# Patient Record
Sex: Female | Born: 1985 | Race: White | Hispanic: No | Marital: Single | Smoking: Former smoker
Health system: Southern US, Community
[De-identification: ages and names within clinical notes are randomized; demographics above are authoritative.]

## PROBLEM LIST (undated history)

## (undated) DIAGNOSIS — I10 Essential (primary) hypertension: Secondary | ICD-10-CM

## (undated) DIAGNOSIS — G43909 Migraine, unspecified, not intractable, without status migrainosus: Secondary | ICD-10-CM

## (undated) DIAGNOSIS — N390 Urinary tract infection, site not specified: Secondary | ICD-10-CM

## (undated) HISTORY — DX: Migraine, unspecified, not intractable, without status migrainosus: G43.909

## (undated) HISTORY — DX: Essential (primary) hypertension: I10

## (undated) HISTORY — DX: Urinary tract infection, site not specified: N39.0

---

## 2011-06-09 ENCOUNTER — Ambulatory Visit: Payer: Self-pay | Admitting: Family Medicine

## 2011-06-28 ENCOUNTER — Ambulatory Visit: Payer: Self-pay | Admitting: Family Medicine

## 2011-06-28 DIAGNOSIS — Z0289 Encounter for other administrative examinations: Secondary | ICD-10-CM

## 2012-02-19 ENCOUNTER — Encounter: Payer: Self-pay | Admitting: Family Medicine

## 2012-02-19 ENCOUNTER — Ambulatory Visit (INDEPENDENT_AMBULATORY_CARE_PROVIDER_SITE_OTHER): Payer: BC Managed Care – PPO | Admitting: Family Medicine

## 2012-02-19 VITALS — BP 144/100 | HR 76 | Temp 98.2°F | Ht 63.0 in | Wt 179.5 lb

## 2012-02-19 DIAGNOSIS — E663 Overweight: Secondary | ICD-10-CM | POA: Insufficient documentation

## 2012-02-19 DIAGNOSIS — R5381 Other malaise: Secondary | ICD-10-CM

## 2012-02-19 DIAGNOSIS — G43909 Migraine, unspecified, not intractable, without status migrainosus: Secondary | ICD-10-CM | POA: Insufficient documentation

## 2012-02-19 DIAGNOSIS — I10 Essential (primary) hypertension: Secondary | ICD-10-CM | POA: Insufficient documentation

## 2012-02-19 DIAGNOSIS — R5383 Other fatigue: Secondary | ICD-10-CM | POA: Insufficient documentation

## 2012-02-19 LAB — LIPID PANEL
HDL: 77.3 mg/dL (ref >39.00)
Triglycerides: 163 mg/dL — ABNORMAL HIGH (ref 0.0–149.0)

## 2012-02-19 LAB — COMPREHENSIVE METABOLIC PANEL
Albumin: 3.9 g/dL (ref 3.5–5.2)
Alkaline Phosphatase: 66 U/L (ref 39–117)
BUN: 13 mg/dL (ref 6–23)
CO2: 25 mEq/L (ref 19–32)
GFR: 91.77 mL/min (ref >60.00)
Glucose, Bld: 77 mg/dL (ref 70–99)
Potassium: 4.3 mEq/L (ref 3.5–5.1)
Total Bilirubin: 0.6 mg/dL (ref 0.3–1.2)
Total Protein: 7.4 g/dL (ref 6.0–8.3)

## 2012-02-19 LAB — CBC WITH DIFFERENTIAL/PLATELET
Basophils Absolute: 0 10*3/uL (ref 0.0–0.1)
Eosinophils Relative: 2.8 % (ref 0.0–5.0)
HCT: 42 % (ref 36.0–46.0)
Lymphocytes Relative: 40.7 % (ref 12.0–46.0)
Lymphs Abs: 2.3 10*3/uL (ref 0.7–4.0)
Monocytes Relative: 7.5 % (ref 3.0–12.0)
Platelets: 308 10*3/uL (ref 150.0–400.0)
RDW: 13.5 % (ref 11.5–14.6)
WBC: 5.7 10*3/uL (ref 4.5–10.5)

## 2012-02-19 LAB — TSH: TSH: 0.68 u[IU]/mL (ref 0.35–5.50)

## 2012-02-19 LAB — VITAMIN B12: Vitamin B-12: 191 pg/mL — ABNORMAL LOW (ref 211–911)

## 2012-02-19 LAB — LDL CHOLESTEROL, DIRECT: Direct LDL: 118.1 mg/dL

## 2012-02-19 MED ORDER — HYDROCHLOROTHIAZIDE 12.5 MG PO CAPS: 12.5000 mg | ORAL_CAPSULE | Freq: Every day | ORAL | Status: DC

## 2012-02-19 NOTE — Assessment & Plan Note (Signed)
Check for reversible causes of fatigue.  

## 2012-02-19 NOTE — Progress Notes (Signed)
Subjective:    Patient ID: Nichole Mendoza, female    DOB: 18-Mar-1986, 26 y.o.   MRN: [id number]  HPI CC: new pt to establish  HTN - on several different occasion.  First noted at [facility] eval (12/2011).  145-160/100.  Has been checking at local pharmacy.    Migraines 1x/wk associated with photo/phonophobia and nausea/vomiting, usually on right side of head.  Milder HA several times a week. Has tried imitrex in past, bad reaction to this.  Ibuprofen 800mg  works ok.  In past year has changed diet to eat healthier, exercise more, however has been gaining weight despite all this.  Gained 15 lbs in last 1 year.  Also notes staying fatigued on a consistent basis.  + skin changes - drier.  + heat and cold intolerance.  On average sleeping 7-8 hours /night.  1 cup coffee/day.  + abd pain for last year - sharp pains in lower abdomen associated with distension.  Not related to cycle.    Some depression/anxiety recently for last year, worse since father died.  Died from lung cancer.  Sometimes affecting relationship with husband.  Has considered counseling in past.  Sleep, appetite, concentration ok.  Energy level down.  No SI/HI, no guilt feelings.  LMP: 02/06/2012.  Regular cycle, about 5 days, no heavy bleeding. Recently stopped birth control, had been on birth control for 12 years prior. Talking about pregnancy with husband.  Caffeine: 1 cup coffee Lives with husband, 1 step son, 2 cats Occupation: Airline pilot Edu: some college Activity: runs 4-5 days/wk 3-5 mi Diet: healthy.  Chicken, fish, vegetables.  Avoids carbs.  Good water, vegetables daily.  Doesn't like fruits.  Preventative: Well woman with [facility] 01/2011.  Normal blood pressure then. Unsure about tetanus shot. Declines flu shot today.  Medications and allergies reviewed and updated in chart.  Past histories reviewed and updated if relevant as below. There is no problem list on file for this patient.  Past Medical History    Diagnosis Date  . Migraines   . HTN (hypertension)    No past surgical history on file. History  Substance Use Topics  . Smoking status: Former Games developer  . Smokeless tobacco: Never Used   Comment: occasional social  . Alcohol Use: Yes     3 times/week   Family History  Problem Relation Age of Onset  . Heart disease Maternal Grandfather 50  . Cancer Father 60    lung, throat, smoker  . Hyperlipidemia Mother   . Alcohol abuse Father   . Bipolar disorder Father   . Drug abuse Father   . Diabetes Neg Hx   . Hypertension Neg Hx   . Stroke Neg Hx    No Known Allergies No current outpatient prescriptions on file prior to visit.     Review of Systems  Constitutional: Positive for unexpected weight change. Negative for fever, chills, activity change, appetite change and fatigue.  HENT: Negative for hearing loss and neck pain.   Eyes: Negative for visual disturbance.  Respiratory: Negative for cough, chest tightness, shortness of breath and wheezing.   Cardiovascular: Negative for chest pain, palpitations and leg swelling.  Gastrointestinal: Positive for abdominal pain and constipation. Negative for nausea, vomiting, diarrhea, blood in stool and abdominal distention.  Genitourinary: Negative for hematuria and difficulty urinating.  Musculoskeletal: Negative for myalgias and arthralgias.  Skin: Negative for rash.  Neurological: Positive for headaches (migraines). Negative for dizziness, seizures and syncope.  Hematological: Does not bruise/bleed easily.  Psychiatric/Behavioral: Positive  for dysphoric mood. The patient is nervous/anxious.        Objective:   Physical Exam  Nursing note and vitals reviewed. Constitutional: She is oriented to person, place, and time. She appears well-developed and well-nourished. No distress.       overweight  HENT:  Head: Normocephalic and atraumatic.  Right Ear: External ear normal.  Left Ear: External ear normal.  Nose: Nose normal.   Mouth/Throat: Oropharynx is clear and moist. No oropharyngeal exudate.  Eyes: Conjunctivae normal and EOM are normal. Pupils are equal, round, and reactive to light. No scleral icterus.  Neck: Normal range of motion. Neck supple. No thyromegaly present.  Cardiovascular: Normal rate, regular rhythm, normal heart sounds and intact distal pulses.   No murmur heard. Pulses:      Radial pulses are 2+ on the right side, and 2+ on the left side.  Pulmonary/Chest: Effort normal and breath sounds normal. No respiratory distress. She has no wheezes. She has no rales.  Abdominal: Soft. Bowel sounds are normal. She exhibits no distension and no mass. There is no tenderness. There is no rebound and no guarding.  Musculoskeletal: Normal range of motion. She exhibits no edema.  Lymphadenopathy:    She has no cervical adenopathy.  Neurological: She is alert and oriented to person, place, and time.       CN grossly intact, station and gait intact  Skin: Skin is warm and dry. No rash noted.  Psychiatric: She has a normal mood and affect. Her behavior is normal. Judgment and thought content normal.       Assessment & Plan:

## 2012-02-19 NOTE — Assessment & Plan Note (Signed)
1x/month.  Continue to monitor for now. Declines flexeril.  Has not tolerated triptan in past. Pt declines daily ppx medication currently.

## 2012-02-19 NOTE — Assessment & Plan Note (Addendum)
New dx as of last few months.  Young for HTN, no fmhx.  Will start with checking blood work.  Consider checking aldosterone next visit if difficult to control BP. Check EKG next visit, repeat K.

## 2012-02-19 NOTE — Assessment & Plan Note (Signed)
Strange that she's continued to gain weight despite very healthy diet/lifestyle. Check TSH, consider checking further blood work next month.

## 2012-02-19 NOTE — Patient Instructions (Signed)
Blood work today to check on blood pressure and fatigue. Let's start you on hydrochlorothiazide today - water pill. Return in 1 month for follow up, sooner if needed. Good to meet you today, call us with questions

## 2012-02-20 ENCOUNTER — Other Ambulatory Visit: Payer: Self-pay | Admitting: *Deleted

## 2012-02-20 LAB — VITAMIN D 25 HYDROXY (VIT D DEFICIENCY, FRACTURES): Vit D, 25-Hydroxy: 36 ng/mL (ref 30–89)

## 2012-02-21 ENCOUNTER — Ambulatory Visit (INDEPENDENT_AMBULATORY_CARE_PROVIDER_SITE_OTHER): Payer: BC Managed Care – PPO | Admitting: Family Medicine

## 2012-02-21 DIAGNOSIS — E538 Deficiency of other specified B group vitamins: Secondary | ICD-10-CM

## 2012-02-21 MED ORDER — CYANOCOBALAMIN 1000 MCG/ML IJ SOLN
1000.0000 ug | Freq: Once | INTRAMUSCULAR | Status: AC
  Administered 2012-02-21: 1000 ug via INTRAMUSCULAR

## 2012-02-22 ENCOUNTER — Telehealth: Payer: Self-pay

## 2012-02-22 NOTE — Telephone Encounter (Signed)
Pt started HCTZ 02/19/12 as prescribed; on 02/21/12 the first time exercised (running at gym)since been on HCTZ pt got clammy,pale and felt like going to pass out (pt did not pass out); pt layed down for 15 mins, drank Coke and after 1 hour felt normal self. Pt had gotten Vit B12 shot one hour before exercising also.  Pt took HCTZ today (has not been exercising today) and feels normal.  Pt wants to be able to exercise without feeling like going to pass out.Please advise.[facility]. Advised pt not to exercise until heard from Dr Sharen Hones.pt voiced understanding.

## 2012-02-23 NOTE — Telephone Encounter (Signed)
Patient notified. She will make sure she is staying well hydrated and hold off on exercise x 1 week . She will check BP and let me know if running too high or too low.

## 2012-02-23 NOTE — Telephone Encounter (Signed)
This may just be period while body gets used to hydrochlorothiazide.  I would give more time, not exercise for 1 wk while starting medicine. HCTZ is a water pill - so also important that she is very careful with hydration status - esp if running and sweating.  Has she been sure to drink plenty of water? It would also be helpful to see how BP is running at home - can she check at home or at local store occasionally?

## 2012-02-29 ENCOUNTER — Encounter: Payer: Self-pay | Admitting: Family Medicine

## 2012-02-29 ENCOUNTER — Ambulatory Visit (INDEPENDENT_AMBULATORY_CARE_PROVIDER_SITE_OTHER): Payer: BC Managed Care – PPO | Admitting: Family Medicine

## 2012-02-29 VITALS — BP 138/80 | HR 98 | Temp 97.1°F | Wt 178.0 lb

## 2012-02-29 DIAGNOSIS — J069 Acute upper respiratory infection, unspecified: Secondary | ICD-10-CM

## 2012-02-29 MED ORDER — HYDROCODONE-HOMATROPINE 5-1.5 MG/5ML PO SYRP
5.0000 mL | ORAL_SOLUTION | Freq: Three times a day (TID) | ORAL | Status: DC | PRN
Start: 2012-02-29 — End: 2012-10-10

## 2012-02-29 NOTE — Patient Instructions (Addendum)
This is likely a virus.  Since you have high blood pressure, please avoid anything sudafed or labeled "D."    Drink lots of fluids.  Treat sympotmatically with Mucinex, nasal saline irrigation, and Tylenol/Ibuprofen. Also try claritin  or zyrtec over the counter- two times a day as needed.  You can use warm compresses.  Cough suppressant at night. Call if not improving as expected in 5-7 days.

## 2012-02-29 NOTE — Progress Notes (Signed)
SUBJECTIVE:  Nichole Mendoza is a 26 y.o. female who complains of coryza, congestion, sneezing and sore throat for 1 day. She denies a history of anorexia, chest pain, dizziness, fevers, myalgias, shortness of breath, sweats, vomiting and weakness and denies a history of asthma. Patient denies smoke cigarettes.  Patient Active Problem List  Diagnosis  . Migraines  . HTN (hypertension)  . Fatigue  . Overweight   Past Medical History  Diagnosis Date  . Migraines   . HTN (hypertension)    No past surgical history on file. History  Substance Use Topics  . Smoking status: Former Games developer  . Smokeless tobacco: Never Used     Comment: occasional social  . Alcohol Use: Yes     Comment: 3 times/week   Family History  Problem Relation Age of Onset  . Heart disease Maternal Grandfather 50  . Cancer Father 60    lung, throat, smoker  . Hyperlipidemia Mother   . Alcohol abuse Father   . Bipolar disorder Father   . Drug abuse Father   . Diabetes Neg Hx   . Hypertension Neg Hx   . Stroke Neg Hx    No Known Allergies Current Outpatient Prescriptions on File Prior to Visit  Medication Sig Dispense Refill  . cyanocobalamin (,VITAMIN B-12,) 1000 MCG/ML injection Inject 1,000 mcg into the muscle every 30 (thirty) days.      . hydrochlorothiazide (MICROZIDE) 12.5 MG capsule Take 1 capsule (12.5 mg total) by mouth daily.  30 capsule  3   The PMH, PSH, Social History, Family History, Medications, and allergies have been reviewed in [facility], and have been updated if relevant.    OBJECTIVE: BP 138/80  Pulse 98  Temp 97.1 F (36.2 C)  Wt 178 lb (80.74 kg)  LMP 02/06/2012  She appears well, vital signs are as noted. Ears normal.  Throat and pharynx normal.  Neck supple. No adenopathy in the neck. Nose is congested. Sinuses non tender. The chest is clear, without wheezes or rales.  ASSESSMENT:  viral upper respiratory illness  PLAN: Symptomatic therapy suggested: push fluids, rest and return  office visit prn if symptoms persist or worsen. Lack of antibiotic effectiveness discussed with her. Call or return to clinic prn if these symptoms worsen or fail to improve as anticipated.

## 2012-03-18 ENCOUNTER — Ambulatory Visit: Payer: BC Managed Care – PPO | Admitting: Family Medicine

## 2012-03-26 ENCOUNTER — Ambulatory Visit (INDEPENDENT_AMBULATORY_CARE_PROVIDER_SITE_OTHER): Payer: BC Managed Care – PPO

## 2012-03-26 DIAGNOSIS — E538 Deficiency of other specified B group vitamins: Secondary | ICD-10-CM

## 2012-03-26 MED ORDER — CYANOCOBALAMIN 1000 MCG/ML IJ SOLN
1000.0000 ug | Freq: Once | INTRAMUSCULAR | Status: AC
  Administered 2012-03-26: 1000 ug via INTRAMUSCULAR

## 2012-04-30 ENCOUNTER — Ambulatory Visit (INDEPENDENT_AMBULATORY_CARE_PROVIDER_SITE_OTHER): Payer: BC Managed Care – PPO | Admitting: *Deleted

## 2012-04-30 DIAGNOSIS — E538 Deficiency of other specified B group vitamins: Secondary | ICD-10-CM

## 2012-04-30 MED ORDER — CYANOCOBALAMIN 1000 MCG/ML IJ SOLN
1000.0000 ug | Freq: Once | INTRAMUSCULAR | Status: AC
  Administered 2012-04-30: 1000 ug via INTRAMUSCULAR

## 2012-06-04 ENCOUNTER — Ambulatory Visit (INDEPENDENT_AMBULATORY_CARE_PROVIDER_SITE_OTHER): Payer: BC Managed Care – PPO | Admitting: *Deleted

## 2012-06-04 DIAGNOSIS — E538 Deficiency of other specified B group vitamins: Secondary | ICD-10-CM

## 2012-06-04 MED ORDER — CYANOCOBALAMIN 1000 MCG/ML IJ SOLN
1000.0000 ug | Freq: Once | INTRAMUSCULAR | Status: AC
  Administered 2012-06-04: 1000 ug via INTRAMUSCULAR

## 2012-06-30 ENCOUNTER — Other Ambulatory Visit: Payer: Self-pay | Admitting: Family Medicine

## 2012-07-02 ENCOUNTER — Ambulatory Visit: Payer: BC Managed Care – PPO

## 2012-09-30 ENCOUNTER — Ambulatory Visit: Payer: BC Managed Care – PPO | Admitting: Family Medicine

## 2012-09-30 DIAGNOSIS — Z0289 Encounter for other administrative examinations: Secondary | ICD-10-CM

## 2012-10-10 ENCOUNTER — Encounter: Payer: Self-pay | Admitting: Family Medicine

## 2012-10-10 ENCOUNTER — Ambulatory Visit (INDEPENDENT_AMBULATORY_CARE_PROVIDER_SITE_OTHER): Payer: BC Managed Care – PPO | Admitting: Family Medicine

## 2012-10-10 VITALS — BP 138/90 | HR 66 | Temp 98.3°F | Wt 162.5 lb

## 2012-10-10 DIAGNOSIS — J029 Acute pharyngitis, unspecified: Secondary | ICD-10-CM

## 2012-10-10 MED ORDER — AZITHROMYCIN 250 MG PO TABS: ORAL_TABLET | ORAL | Status: AC

## 2012-10-10 MED ORDER — HYDROCODONE-HOMATROPINE 5-1.5 MG/5ML PO SYRP: 5.0000 mL | ORAL_SOLUTION | Freq: Three times a day (TID) | ORAL | Status: AC | PRN

## 2012-10-10 NOTE — Progress Notes (Signed)
Pt seen and examined with PA student, Anna Genre.   Subjective:    Patient ID: Nichole Mendoza, female    DOB: 1986/01/31, 27 y.o.   MRN: [id number]  HPI CC: ST, Cough  Pleasant 27yo with h/o HTN and migraines presents today with 1d h/o ST and cough.  Today feeling more congested, neck has started hurting.  Cough nonproductive.  some pain at jawline inferior to ears.  + HA, no sinus pressure.  Trouble sleeping 2/2 pain.  No appetite.  No recent illness.  ST progressively worsening.  No fever, chills, nausea/vomiting. So far has tried zinc drops.  No sick contacts at home. No smokers at home. No h/o asthma.  H/o strep in the past.  HTN - not currently taking HCTZ.  Checking bp at home - feels running well.  Wt Readings from Last 3 Encounters:  10/10/12 162 lb 8 oz (73.71 kg)  02/29/12 178 lb (80.74 kg)  02/19/12 179 lb 8 oz (81.421 kg)    Past Medical History  Diagnosis Date  . Migraines   . HTN (hypertension)     History reviewed. No pertinent past surgical history.  Review of Systems Per HPI    Objective:   Physical Exam  Nursing note and vitals reviewed. Constitutional: She appears well-developed and well-nourished. No distress.  HENT:  Head: Normocephalic and atraumatic.  Right Ear: Hearing, tympanic membrane, external ear and ear canal normal.  Left Ear: Hearing, tympanic membrane, external ear and ear canal normal.  Nose: Nose normal. No mucosal edema or rhinorrhea. Right sinus exhibits no maxillary sinus tenderness and no frontal sinus tenderness. Left sinus exhibits no maxillary sinus tenderness and no frontal sinus tenderness.  Mouth/Throat: Uvula is midline and mucous membranes are normal. Oropharyngeal exudate, posterior oropharyngeal edema and posterior oropharyngeal erythema present. No tonsillar abscesses.  Left tonsillar exudates  Eyes: Conjunctivae and EOM are normal. Pupils are equal, round, and reactive to light. No scleral icterus.  Neck: Normal  range of motion. Neck supple.  Cardiovascular: Normal rate, regular rhythm, normal heart sounds and intact distal pulses.   No murmur heard. Pulmonary/Chest: Effort normal and breath sounds normal. No respiratory distress. She has no wheezes. She has no rales.  Nagging hacking cough present Coarse breath sounds  Lymphadenopathy:    She has cervical adenopathy (tender RAC LAD).  Skin: Skin is warm and dry. No rash noted.       Assessment & Plan:

## 2012-10-10 NOTE — Assessment & Plan Note (Signed)
With bronchitis (likely viral). Given L tonsillar exudates and erythema and R AC LAD, anticipate strep throat - check RST today. Will treat with azithromycin to cover bronchitis component. Hydrocodone cough syrup for cough at night time. Pt agrees with plan.

## 2012-10-10 NOTE — Patient Instructions (Signed)
I think you have strep throat as well as bronchitis. Treat with zpack. Use hycodan cough syrup for cough at night time. Update [place] if not improving as expected.

## 2012-10-30 ENCOUNTER — Ambulatory Visit: Payer: BC Managed Care – PPO

## 2012-11-01 ENCOUNTER — Ambulatory Visit: Payer: BC Managed Care – PPO

## 2012-11-05 ENCOUNTER — Ambulatory Visit (INDEPENDENT_AMBULATORY_CARE_PROVIDER_SITE_OTHER): Payer: BC Managed Care – PPO | Admitting: *Deleted

## 2012-11-05 DIAGNOSIS — E538 Deficiency of other specified B group vitamins: Secondary | ICD-10-CM

## 2012-11-05 MED ORDER — CYANOCOBALAMIN 1000 MCG/ML IJ SOLN
1000.0000 ug | Freq: Once | INTRAMUSCULAR | Status: AC
  Administered 2012-11-05: 1000 ug via INTRAMUSCULAR

## 2012-12-18 ENCOUNTER — Ambulatory Visit: Payer: BC Managed Care – PPO

## 2013-08-08 ENCOUNTER — Telehealth: Payer: Self-pay | Admitting: Family Medicine

## 2013-08-08 NOTE — Telephone Encounter (Signed)
Can work in at [employer]:15. Needs to be seen to determine i antibitoics appropriate.

## 2013-08-08 NOTE — Telephone Encounter (Signed)
Patient did not return my call to confirm if she wanted the appointment at 4:15pm.

## 2013-08-08 NOTE — Telephone Encounter (Signed)
Patient Information:  Caller Name: Nichole Mendoza  Phone: [phone]  Patient: Nichole Mendoza, Nichole Mendoza  Gender: Female  DOB: Sep 29, 1985  Age: 28 Years  PCP: Eustaquio BoydenGutierrez, Nichole [facility](Family Practice)  Pregnant: No  Office Follow Up:  Does the office need to follow up with this patient?: Yes  Instructions For The Office: No appts. available in Epic Electronic Health Record. Patient declines an appt. an another office location. Patient is requesting a Rx for ZPack and a Rx cough suppressant [facility] [address] at [phone]. Please return call to patient at [phone].  RN Note:  Patient states she developed cough, nasal and chest congestion. Onset 08/05/13. Patient states cough and congestion have become progressively worse. Patient is afebrile. Patient is taking fluids well. Patient states cough is unrelieved with Delsym. Patient states she is also experiencing bilateral ear congestion/pain and facial pain.  Care advice given per guidelines. Patient advised increased fluids, warm fluids with honey. saline nasal washes, inhaled steam, humidifier, warm compresses to face and ears. Patient advised to try Mucinex DM in place of Delsym. Patient advised may take Ibuprofen 600mg ., with food, q 6-8 hours as needed for pain. Call back parameters reviewed. Patient verbalizes understanding. No appts. available in Epic Electronic Health Record. Patient declines an appt. an another office location. Patient is requesting a Rx for ZPack and a Rx cough suppressant [facility] [address] at [phone]. Please return call to patient at [phone].   Symptoms  Reason For Call & Symptoms: Cough, nasal and chest congestion  Reviewed Health History In EMR: Yes  Reviewed Medications In EMR: Yes  Reviewed Allergies In EMR: Yes  Reviewed Surgeries / Procedures: Yes  Date of Onset of Symptoms: 08/05/2013  Treatments Tried: Zyrtec, Delsym  Treatments Tried Worked:  No OB / GYN:  LMP: 07/25/2013  Guideline(s) Used:  Colds  Disposition Per Guideline:   See Today in Office  Reason For Disposition Reached:   Earache  Advice Given:  Humidifier:  If the air in your home is dry, use a cool-mist humidifier  Humidifier:  If the air in your home is dry, use a cool-mist humidifier  Call Back If:  Difficulty breathing occurs  You become worse  Patient Will Follow Care Advice:  YES

## 2013-08-08 NOTE — Telephone Encounter (Signed)
Left message for patient to return my call.

## 2013-11-22 DIAGNOSIS — N390 Urinary tract infection, site not specified: Secondary | ICD-10-CM

## 2013-11-22 HISTORY — DX: Urinary tract infection, site not specified: N39.0

## 2013-11-24 ENCOUNTER — Ambulatory Visit: Payer: Self-pay | Admitting: Family Medicine

## 2013-11-24 DIAGNOSIS — Z0289 Encounter for other administrative examinations: Secondary | ICD-10-CM

## 2013-12-01 ENCOUNTER — Encounter: Payer: Self-pay | Admitting: Family Medicine

## 2014-01-13 ENCOUNTER — Encounter (HOSPITAL_COMMUNITY): Payer: Self-pay | Admitting: Emergency Medicine

## 2014-01-13 ENCOUNTER — Emergency Department (HOSPITAL_COMMUNITY): Payer: No Typology Code available for payment source

## 2014-01-13 ENCOUNTER — Emergency Department (HOSPITAL_COMMUNITY)
Admission: EM | Admit: 2014-01-13 | Discharge: 2014-01-13 | Disposition: A | Discharge status: Home / Self Care | Payer: No Typology Code available for payment source | Attending: Emergency Medicine | Admitting: Emergency Medicine

## 2014-01-13 DIAGNOSIS — Y92410 Unspecified street and highway as the place of occurrence of the external cause: Secondary | ICD-10-CM | POA: Diagnosis not present

## 2014-01-13 DIAGNOSIS — I10 Essential (primary) hypertension: Secondary | ICD-10-CM | POA: Insufficient documentation

## 2014-01-13 DIAGNOSIS — S239XXA Sprain of unspecified parts of thorax, initial encounter: Secondary | ICD-10-CM | POA: Diagnosis not present

## 2014-01-13 DIAGNOSIS — Z87891 Personal history of nicotine dependence: Secondary | ICD-10-CM | POA: Diagnosis not present

## 2014-01-13 DIAGNOSIS — Y9389 Activity, other specified: Secondary | ICD-10-CM | POA: Diagnosis not present

## 2014-01-13 DIAGNOSIS — S0993XA Unspecified injury of face, initial encounter: Secondary | ICD-10-CM | POA: Insufficient documentation

## 2014-01-13 DIAGNOSIS — Z87440 Personal history of urinary (tract) infections: Secondary | ICD-10-CM | POA: Insufficient documentation

## 2014-01-13 DIAGNOSIS — S199XXA Unspecified injury of neck, initial encounter: Secondary | ICD-10-CM

## 2014-01-13 DIAGNOSIS — Z792 Long term (current) use of antibiotics: Secondary | ICD-10-CM | POA: Diagnosis not present

## 2014-01-13 DIAGNOSIS — S139XXA Sprain of joints and ligaments of unspecified parts of neck, initial encounter: Secondary | ICD-10-CM | POA: Insufficient documentation

## 2014-01-13 DIAGNOSIS — Z79899 Other long term (current) drug therapy: Secondary | ICD-10-CM | POA: Diagnosis not present

## 2014-01-13 DIAGNOSIS — S29012A Strain of muscle and tendon of back wall of thorax, initial encounter: Secondary | ICD-10-CM

## 2014-01-13 MED ORDER — KETOROLAC TROMETHAMINE 60 MG/2ML IM SOLN
60.0000 mg | Freq: Once | INTRAMUSCULAR | Status: AC
  Administered 2014-01-13: 60 mg via INTRAMUSCULAR
  Filled 2014-01-13: qty 2

## 2014-01-13 MED ORDER — OXYCODONE-ACETAMINOPHEN 5-325 MG PO TABS
2.0000 | ORAL_TABLET | Freq: Once | ORAL | Status: AC
  Administered 2014-01-13: 2 via ORAL
  Filled 2014-01-13: qty 2

## 2014-01-13 MED ORDER — DIAZEPAM 5 MG PO TABS
5.0000 mg | ORAL_TABLET | Freq: Once | ORAL | Status: AC
Start: 2014-01-13 — End: 2014-01-13
  Administered 2014-01-13: 5 mg via ORAL
  Filled 2014-01-13: qty 1

## 2014-01-13 MED ORDER — DIAZEPAM 5 MG PO TABS: 5.0000 mg | ORAL_TABLET | Freq: Two times a day (BID) | ORAL | Status: AC

## 2014-01-13 MED ORDER — HYDROCODONE-IBUPROFEN 7.5-200 MG PO TABS: 1.0000 | ORAL_TABLET | Freq: Four times a day (QID) | ORAL | Status: AC | PRN
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
## 2014-01-18 NOTE — ED Provider Notes (Signed)
Medical screening examination/treatment/procedure(s) were performed by non-physician practitioner and as supervising physician I was immediately available for consultation/collaboration.   EKG Interpretation None        Rolland Porter, MD 01/18/14 [phone]
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
